# Patient Record
Sex: Female | Born: 1980 | Race: Black or African American | Hispanic: No | Marital: Single | Smoking: Never smoker
Health system: Southern US, Community
[De-identification: ages and names within clinical notes are randomized; demographics above are authoritative.]

## PROBLEM LIST (undated history)

## (undated) HISTORY — PX: TUBAL LIGATION: SHX77

## (undated) HISTORY — PX: CHOLECYSTECTOMY: SHX55

## (undated) HISTORY — PX: LAPAROSCOPIC GASTRIC SLEEVE RESECTION: SHX5895

---

## 2012-06-18 ENCOUNTER — Emergency Department: Payer: Self-pay | Admitting: Emergency Medicine

## 2012-06-25 ENCOUNTER — Ambulatory Visit: Payer: Self-pay

## 2014-09-02 ENCOUNTER — Encounter: Payer: Self-pay | Admitting: *Deleted

## 2014-09-02 ENCOUNTER — Emergency Department: Payer: Medicaid Other

## 2014-09-02 ENCOUNTER — Emergency Department
Admission: EM | Admit: 2014-09-02 | Discharge: 2014-09-02 | Disposition: A | Discharge status: Home / Self Care | Payer: Medicaid Other | Attending: Emergency Medicine | Admitting: Emergency Medicine

## 2014-09-02 DIAGNOSIS — Z880 Allergy status to penicillin: Secondary | ICD-10-CM | POA: Diagnosis not present

## 2014-09-02 DIAGNOSIS — N9089 Other specified noninflammatory disorders of vulva and perineum: Secondary | ICD-10-CM | POA: Insufficient documentation

## 2014-09-02 DIAGNOSIS — K5792 Diverticulitis of intestine, part unspecified, without perforation or abscess without bleeding: Secondary | ICD-10-CM | POA: Insufficient documentation

## 2014-09-02 DIAGNOSIS — Z3202 Encounter for pregnancy test, result negative: Secondary | ICD-10-CM | POA: Diagnosis not present

## 2014-09-02 DIAGNOSIS — R102 Pelvic and perineal pain: Secondary | ICD-10-CM | POA: Diagnosis present

## 2014-09-02 LAB — WET PREP, GENITAL
Clue Cells Wet Prep HPF POC: NONE SEEN
Trich, Wet Prep: NONE SEEN
Yeast Wet Prep HPF POC: NONE SEEN

## 2014-09-02 LAB — URINALYSIS COMPLETE WITH MICROSCOPIC (ARMC ONLY)
Bacteria, UA: NONE SEEN
Bilirubin Urine: NEGATIVE
Glucose, UA: NEGATIVE mg/dL
Ketones, ur: NEGATIVE mg/dL
Leukocytes, UA: NEGATIVE
Nitrite: NEGATIVE
PH: 5 (ref 5.0–8.0)
Protein, ur: NEGATIVE mg/dL
SPECIFIC GRAVITY, URINE: 1.023 (ref 1.005–1.030)

## 2014-09-02 LAB — COMPREHENSIVE METABOLIC PANEL
ALBUMIN: 3.3 g/dL — AB (ref 3.5–5.0)
ALT: 16 U/L (ref 14–54)
AST: 15 U/L (ref 15–41)
Alkaline Phosphatase: 79 U/L (ref 38–126)
Anion gap: 9 (ref 5–15)
BUN: 10 mg/dL (ref 6–20)
CALCIUM: 9.1 mg/dL (ref 8.9–10.3)
CHLORIDE: 103 mmol/L (ref 101–111)
CO2: 24 mmol/L (ref 22–32)
CREATININE: 0.93 mg/dL (ref 0.44–1.00)
GFR calc non Af Amer: >60 mL/min (ref >60)
Glucose, Bld: 116 mg/dL — ABNORMAL HIGH (ref 65–99)
Potassium: 3.6 mmol/L (ref 3.5–5.1)
SODIUM: 136 mmol/L (ref 135–145)
Total Bilirubin: 0.6 mg/dL (ref 0.3–1.2)
Total Protein: 7.2 g/dL (ref 6.5–8.1)

## 2014-09-02 LAB — CHLAMYDIA/NGC RT PCR (ARMC ONLY)
Chlamydia Tr: NOT DETECTED
N GONORRHOEAE: NOT DETECTED

## 2014-09-02 LAB — CBC WITH DIFFERENTIAL/PLATELET
Basophils Absolute: 0.1 10*3/uL (ref 0–0.1)
Basophils Relative: 1 %
Eosinophils Absolute: 0.1 10*3/uL (ref 0–0.7)
Eosinophils Relative: 1 %
HEMATOCRIT: 41.8 % (ref 35.0–47.0)
Hemoglobin: 13.4 g/dL (ref 12.0–16.0)
Lymphocytes Relative: 11 %
Lymphs Abs: 1.7 10*3/uL (ref 1.0–3.6)
MCH: 27.1 pg (ref 26.0–34.0)
MCHC: 32.1 g/dL (ref 32.0–36.0)
MCV: 84.2 fL (ref 80.0–100.0)
Monocytes Absolute: 0.7 10*3/uL (ref 0.2–0.9)
Monocytes Relative: 5 %
NEUTROS PCT: 82 %
Neutro Abs: 12.6 10*3/uL — ABNORMAL HIGH (ref 1.4–6.5)
PLATELETS: 242 10*3/uL (ref 150–440)
RBC: 4.96 MIL/uL (ref 3.80–5.20)
RDW: 14.6 % — ABNORMAL HIGH (ref 11.5–14.5)
WBC: 15.1 10*3/uL — AB (ref 3.6–11.0)

## 2014-09-02 LAB — POCT PREGNANCY, URINE: Preg Test, Ur: NEGATIVE

## 2014-09-02 LAB — HCG, QUANTITATIVE, PREGNANCY: HCG, BETA CHAIN, QUANT, S: 1 m[IU]/mL (ref <5)

## 2014-09-02 MED ORDER — METRONIDAZOLE 500 MG PO TABS: 500.0000 mg | ORAL_TABLET | Freq: Three times a day (TID) | ORAL | Status: AC

## 2014-09-02 MED ORDER — OXYCODONE-ACETAMINOPHEN 5-325 MG PO TABS: 1.0000 | ORAL_TABLET | Freq: Three times a day (TID) | ORAL | Status: DC | PRN

## 2014-09-02 MED ORDER — SODIUM CHLORIDE 0.9 % IV SOLN
Freq: Once | INTRAVENOUS | Status: AC
  Administered 2014-09-02: 12:00:00 via INTRAVENOUS

## 2014-09-02 MED ORDER — CIPROFLOXACIN HCL 500 MG PO TABS: 500.0000 mg | ORAL_TABLET | Freq: Two times a day (BID) | ORAL | Status: AC

## 2014-09-02 MED ORDER — IOHEXOL 240 MG/ML SOLN
25.0000 mL | Freq: Once | INTRAMUSCULAR | Status: AC | PRN
  Administered 2014-09-02: 25 mL via ORAL
  Filled 2014-09-02: qty 25

## 2014-09-02 MED ORDER — IOHEXOL 300 MG/ML  SOLN
100.0000 mL | Freq: Once | INTRAMUSCULAR | Status: AC | PRN
  Administered 2014-09-02: 100 mL via INTRAVENOUS
  Filled 2014-09-02: qty 100

## 2014-09-02 MED ORDER — ONDANSETRON 4 MG PO TBDP: 4.0000 mg | ORAL_TABLET | Freq: Three times a day (TID) | ORAL | Status: DC | PRN

## 2014-09-02 NOTE — Discharge Instructions (Signed)
Take medication as prescribed. This is very important. Rest. Drink plenty of fluids. Avoid strenuous activities.  As discussed follow-up closely with her primary care physician. Follow-up this week. If unable to be seen by PCP follow-up with gastroenterology. See above to call to schedule.  Return to the ER for fever, increased pain, inability to tolerate food or fluids, rectal bleeding, new or worsening concerns.  Diverticulitis Diverticulitis is inflammation or infection of small pouches in your colon that form when you have a condition called diverticulosis. The pouches in your colon are called diverticula. Your colon, or large intestine, is where water is absorbed and stool is formed. Complications of diverticulitis can include:  Bleeding.  Severe infection.  Severe pain.  Perforation of your colon.  Obstruction of your colon. CAUSES  Diverticulitis is caused by bacteria. Diverticulitis happens when stool becomes trapped in diverticula. This allows bacteria to grow in the diverticula, which can lead to inflammation and infection. RISK FACTORS People with diverticulosis are at risk for diverticulitis. Eating a diet that does not include enough fiber from fruits and vegetables may make diverticulitis more likely to develop. SYMPTOMS  Symptoms of diverticulitis may include:  Abdominal pain and tenderness. The pain is normally located on the left side of the abdomen, but may occur in other areas.  Fever and chills.  Bloating.  Cramping.  Nausea.  Vomiting.  Constipation.  Diarrhea.  Blood in your stool. DIAGNOSIS  Your health care provider will ask you about your medical history and do a physical exam. You may need to have tests done because many medical conditions can cause the same symptoms as diverticulitis. Tests may include:  Blood tests.  Urine tests.  Imaging tests of the abdomen, including X-rays and CT scans. When your condition is under control, your  health care provider may recommend that you have a colonoscopy. A colonoscopy can show how severe your diverticula are and whether something else is causing your symptoms. TREATMENT  Most cases of diverticulitis are mild and can be treated at home. Treatment may include:  Taking over-the-counter pain medicines.  Following a clear liquid diet.  Taking antibiotic medicines by mouth for 7-10 days. More severe cases may be treated at a hospital. Treatment may include:  Not eating or drinking.  Taking prescription pain medicine.  Receiving antibiotic medicines through an IV tube.  Receiving fluids and nutrition through an IV tube.  Surgery. HOME CARE INSTRUCTIONS   Follow your health care provider's instructions carefully.  Follow a full liquid diet or other diet as directed by your health care provider. After your symptoms improve, your health care provider may tell you to change your diet. He or she may recommend you eat a high-fiber diet. Fruits and vegetables are good sources of fiber. Fiber makes it easier to pass stool.  Take fiber supplements or probiotics as directed by your health care provider.  Only take medicines as directed by your health care provider.  Keep all your follow-up appointments. SEEK MEDICAL CARE IF:   Your pain does not improve.  You have a hard time eating food.  Your bowel movements do not return to normal. SEEK IMMEDIATE MEDICAL CARE IF:   Your pain becomes worse.  Your symptoms do not get better.  Your symptoms suddenly get worse.  You have a fever.  You have repeated vomiting.  You have bloody or black, tarry stools. MAKE SURE YOU:   Understand these instructions.  Will watch your condition.  Will get help right away  if you are not doing well or get worse. Document Released: 12/22/2004 Document Revised: 03/19/2013 Document Reviewed: 02/06/2013 [facility] Patient Information 2015 [employer], [place]. This information is not intended to  replace advice given to you by your health care provider. Make sure you discuss any questions you have with your health care provider.

## 2014-09-02 NOTE — ED Notes (Signed)
Patient transported to CT 

## 2014-09-02 NOTE — ED Notes (Signed)
Pt reports pelvic pain since last Sunday, pt reports clear discharge

## 2014-09-02 NOTE — ED Provider Notes (Signed)
[facility] Emergency Department Provider Note ____________________________________________  Time seen: Approximately 11:58 AM  I have reviewed the triage vital signs and the nursing notes.   HISTORY  Chief Complaint Pelvic Pain   HPI Natasha Reyes is a 34 y.o. female presents to the ER for complaints of 2 day history of pelvic pain. Patient states pelvic pain is primarily on the left. Describes as sharp intermittent pain. Patient states that for approximate 6 months she has had irregular menstrual cycles. With the last menstrual cycle began in April 2016. Patient states that this morning she had a mild amount of clear vaginal discharge. Denies vaginal bleeding or vaginal pain.  States pain is currently 7 out of 10 and then left pelvic and left lower abdomen area. Denies fever, nausea, vomiting, diarrhea, chest pain, shortness of breath. Reports continues to eat and drink well. reports continues to urinate and move bowels normally and well.     History reviewed. No pertinent past medical history. Left ovarian cyst  [id number]  There are no active problems to display for this patient.   Past Surgical History  Procedure Laterality Date  . Cholecystectomy    . Tubal ligation      No current outpatient prescriptions on file.  Allergies Amoxicillin  No family history on file.  Social History History  Substance Use Topics  . Smoking status: Never Smoker   . Smokeless tobacco: Not on file  . Alcohol Use: No    Review of Systems Constitutional: No fever/chills Eyes: No visual changes. ENT: No sore throat. Cardiovascular: Denies chest pain. Respiratory: Denies shortness of breath. Gastrointestinal: Complains of left lower pelvic pain as above.  No nausea, no vomiting.  No diarrhea.  No constipation. Genitourinary: Negative for dysuria. Musculoskeletal: Negative for back pain. Skin: Negative for rash. Neurological: Negative for headaches, focal  weakness or numbness.  10-point ROS otherwise negative.  ____________________________________________   PHYSICAL EXAM:  VITAL SIGNS: ED Triage Vitals  Enc Vitals Group     BP 09/02/14 1035 167/80 mmHg     Pulse Rate 09/02/14 1035 125     Resp 09/02/14 1035 18     Temp 09/02/14 1035 99.9 F (37.7 C)     Temp Source 09/02/14 1035 Oral     SpO2 09/02/14 1035 96 %     Weight 09/02/14 1035 290 lb (131.543 kg)     Height 09/02/14 1035 5\' 7"  (1.702 m)     Head Cir --      Peak Flow --      Pain Score 09/02/14 1102 7     Pain Loc --      Pain Edu? --      Excl. in GC? --   Blood pressure 155/80, pulse 121, temperature 98.3 F (36.8 C), temperature source Oral, resp. rate 18, height 5\' 7"  (1.702 m), weight 290 lb (131.543 kg), last menstrual period 08/11/2014, SpO2 97 %.  Today's Vitals   09/02/14 1137 09/02/14 1618 09/02/14 1716 09/02/14 1716  BP: 155/80 147/83  148/82  Pulse: 121 99  88  Temp: 98.3 F (36.8 C) 98.5 F (36.9 C)    TempSrc: Oral     Resp: 18 16  20   Height: 5\' 7"  (1.702 m)     Weight: 290 lb (131.543 kg)     SpO2: 97% 98%  98%  PainSc:   0-No pain     Constitutional: Alert and oriented. Well appearing and in no acute distress. Eyes: Conjunctivae are normal.  PERRL. EOMI. Head: Atraumatic. Nose: No congestion/rhinnorhea. Mouth/Throat: Mucous membranes are moist.  Oropharynx non-erythematous. Neck: No stridor. No cervical spine tenderness to palpation. Hematological/Lymphatic/Immunilogical: No cervical lymphadenopathy. Cardiovascular: Normal rate, regular rhythm. Grossly normal heart sounds.  Good peripheral circulation. Respiratory: Normal respiratory effort.  No retractions. Lungs CTAB. Gastrointestinal: No distention. No abdominal bruits. No CVA tenderness. Left lower quadrant mild to mod TTP. No guarding.  Genitourinary:  Pelvic exam: with Ed tech Felicia at bedside normal external genitalia Speculum: mild whitish discharge Bimanual: mild to mod  left adnexal area TTP, no cervical or right adnexal tenderness.  Musculoskeletal: No lower extremity tenderness nor edema.  No joint effusions. Neurologic:  Normal speech and language. No gross focal neurologic deficits are appreciated. Speech is normal. No gait instability. Skin:  Skin is warm, dry and intact. No rash noted. Psychiatric: Mood and affect are normal. Speech and behavior are normal.  ____________________________________________   LABS (all labs ordered are listed, but only abnormal results are displayed)  Labs Reviewed  WET PREP, GENITAL - Abnormal; Notable for the following:    WBC, Wet Prep HPF POC FEW (*)    All other components within normal limits  URINALYSIS COMPLETEWITH MICROSCOPIC ([facility] ONLY) - Abnormal; Notable for the following:    Color, Urine YELLOW (*)    APPearance CLEAR (*)    Hgb urine dipstick 1+ (*)    Squamous Epithelial / LPF 6-30 (*)    All other components within normal limits  CBC WITH DIFFERENTIAL/PLATELET - Abnormal; Notable for the following:    WBC 15.1 (*)    RDW 14.6 (*)    Neutro Abs 12.6 (*)    All other components within normal limits  COMPREHENSIVE METABOLIC PANEL - Abnormal; Notable for the following:    Glucose, Bld 116 (*)    Albumin 3.3 (*)    All other components within normal limits  CHLAMYDIA/NGC RT PCR ([facility] ONLY)  HCG, QUANTITATIVE, PREGNANCY  POCT PREGNANCY, URINE    RADIOLOGY  TRANSABDOMINAL AND TRANSVAGINAL ULTRASOUND OF PELVIS  TECHNIQUE: Both transabdominal and transvaginal ultrasound examinations of the pelvis were performed. Transabdominal technique was performed for global imaging of the pelvis including uterus, ovaries, adnexal regions, and pelvic cul-de-sac. It was necessary to proceed with endovaginal exam following the transabdominal exam to visualize the ovaries.  COMPARISON: None  FINDINGS: Uterus  Measurements: 9.7 x 4.7 x 6.6 cm. No fibroids or other  mass visualized.  Endometrium  Thickness: 9 mm. No focal abnormality visualized; trace fluid in the lower endometrial canal (image 66).  Right ovary  Measurements: 3.1 x 2.1 x 3.1 cm. Probable dominant follicle measuring 10 mm (image 56). Normal appearance/no adnexal mass.  Left ovary  Measurements: 3.1 x 2.1 x 1.8 cm. Multiple small follicles. Normal appearance/no adnexal mass.  Other findings  Trace simple appearing pelvic free fluid.  IMPRESSION: No left adnexal abnormality. Essentially normal for age pelvis ultrasound.   Electronically Signed By: Odessa Fleming M.D. On: 09/02/2014 15:05      CT ABDOMEN AND PELVIS WITH CONTRAST  TECHNIQUE: Multidetector CT imaging of the abdomen and pelvis was performed using the standard protocol following bolus administration of intravenous contrast.  CONTRAST: OMNIPAQUE IOHEXOL 300 MG/ML SOLN  COMPARISON: None.  FINDINGS: Lower chest: The lung bases are clear. The heart size is normal.  Hepatobiliary: The liver is diffusely low in attenuation consistent with hepatic steatosis. There is mild pneumobilia. The gallbladder is surgically absent. There is no intrahepatic or extrahepatic biliary ductal dilatation.  Pancreas:  Normal.  Spleen: Normal.  Adrenals/Urinary Tract: The adrenal glands and right kidney are normal. There are left parapelvic cysts. The left kidney in otherwise normal. There is no urolithiasis or obstructive uropathy. The bladder is normal.  Stomach/Bowel: There is no bowel dilatation to suggest obstruction. There is diverticulosis and bowel wall thickening involving the descending colon - sigmoid colon junction with pericolonic inflammatory changes. There is no extra luminal air. There is no pneumoperitoneum, pneumatosis or portal venous gas. There is trace pelvic free fluid.  Vascular/Lymphatic: The abdominal aorta is normal in caliber. There is no abdominal or pelvic lymphadenopathy.  There is no inguinal lymphadenopathy.  Reproductive: The uterus and ovaries are unremarkable.  Other: There is no fluid collection or hematoma.  Musculoskeletal: There is no lytic or sclerotic osseous lesion. There is no acute osseous abnormality.  IMPRESSION: 1. Mild diverticulosis with wall thickening and surrounding inflammation involving the descending colon-sigmoid colon junction most consistent with acute diverticulitis. No focal fluid collection to suggest an abscess.   Electronically Signed By: Elige Ko On: 09/02/2014 16:10 ____________________________________________ _____________________________________   INITIAL IMPRESSION / ASSESSMENT AND PLAN / ED COURSE  Pertinent labs & imaging results that were available during my care of the patient were reviewed by me and considered in my medical decision making (see chart for details).  1400: no acute distress. Resting calmly in bed. Awaiting ultrasound results.   1500: pt reexamined. Reports 7/10 LLQ abdominal pain continued. Denies need for current pain medications. Unremarkable ultrasound, WBC 15, and continued pain. Will evaluated by CT. Pt verbalized understanding and agreed to plan. Patient denies need for pain medication at this time. Spouse at bedside.   1620: Ct abdomen with mild diverticulosis with wall thickening and surrounding inflammation involving descending colon and sigmoid colon junction most consistent with acute diverticulitis,  no focal fluid collection to suggest abscess. Well-appearing patient. Tolerating by mouth fluids in ER. Afebrile. Will treat patient outpatient with oral Cipro and Flagyl as well as when necessary pain medication and nausea. Discussed strict follow-up and return parameters. Patient to follow with PCP in 3 days. Patient agreed to plan. ____________________________________________   FINAL CLINICAL IMPRESSION(S) / ED DIAGNOSES  Final diagnoses:  Adnexal tenderness, left   Diverticulitis of intestine, without perforation or abscess without bleeding      Renford Dills, NP 09/02/14 1731  Myrna Blazer, MD 09/03/14 [phone]

## 2014-09-02 NOTE — ED Notes (Signed)
Pt returned from ultrasound via stretcher.

## 2015-08-24 ENCOUNTER — Encounter: Payer: Self-pay | Admitting: Medical Oncology

## 2015-08-24 ENCOUNTER — Emergency Department
Admission: EM | Admit: 2015-08-24 | Discharge: 2015-08-24 | Disposition: A | Discharge status: Home / Self Care | Payer: Medicaid Other | Attending: Emergency Medicine | Admitting: Emergency Medicine

## 2015-08-24 DIAGNOSIS — Z791 Long term (current) use of non-steroidal anti-inflammatories (NSAID): Secondary | ICD-10-CM | POA: Insufficient documentation

## 2015-08-24 DIAGNOSIS — Z79899 Other long term (current) drug therapy: Secondary | ICD-10-CM | POA: Diagnosis not present

## 2015-08-24 DIAGNOSIS — M545 Low back pain: Secondary | ICD-10-CM | POA: Diagnosis not present

## 2015-08-24 DIAGNOSIS — G8929 Other chronic pain: Secondary | ICD-10-CM | POA: Diagnosis not present

## 2015-08-24 LAB — URINALYSIS COMPLETE WITH MICROSCOPIC (ARMC ONLY)
Bacteria, UA: NONE SEEN
Bilirubin Urine: NEGATIVE
Glucose, UA: NEGATIVE mg/dL
KETONES UR: NEGATIVE mg/dL
LEUKOCYTES UA: NEGATIVE
Nitrite: NEGATIVE
PH: 6 (ref 5.0–8.0)
Protein, ur: NEGATIVE mg/dL
Specific Gravity, Urine: 1.021 (ref 1.005–1.030)

## 2015-08-24 LAB — POCT PREGNANCY, URINE: Preg Test, Ur: NEGATIVE

## 2015-08-24 MED ORDER — TRAMADOL HCL 50 MG PO TABS: ORAL_TABLET | ORAL | Status: DC

## 2015-08-24 MED ORDER — KETOROLAC TROMETHAMINE 60 MG/2ML IM SOLN
60.0000 mg | Freq: Once | INTRAMUSCULAR | Status: AC
  Administered 2015-08-24: 60 mg via INTRAMUSCULAR
  Filled 2015-08-24: qty 2
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
## 2015-08-24 NOTE — Discharge Instructions (Signed)
Continue taking medication that you received yesterday Flexeril every 8 hours and meloxicam once a day. Begin taking tramadol one or 2 tablets every 6 hours as needed for pain. Call tomorrow and make an appointment with Dr. Joice LoftsPoggi who [facility]

## 2015-08-24 NOTE — ED Notes (Signed)
Pt alert and oriented X4, active, cooperative, pt in NAD. RR even and unlabored, color WNL.  Pt informed to return if any life threatening symptoms occur.   
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
## 2015-10-30 IMAGING — CT CT ABD-PELV W/ CM
2 of 4 series · 16 of 46 positions shown, 18 images · IV contrast (omnipaque)
Comparison: None.

CLINICAL DATA: Left lower quadrant pain since [REDACTED]

EXAM:
CT ABDOMEN AND PELVIS WITH CONTRAST
TECHNIQUE: Multidetector CT imaging of the abdomen and pelvis was performed
using the standard protocol following bolus administration of
intravenous contrast.
CONTRAST:  100mL OMNIPAQUE IOHEXOL 300 MG/ML  SOLN

[Series 2: routine abd pel with · axial · 0.98mm/px · z∈[-1448,-984]mm · 13 of 103 slices shown, 15 images]
[im 5/103  soft-tissue]
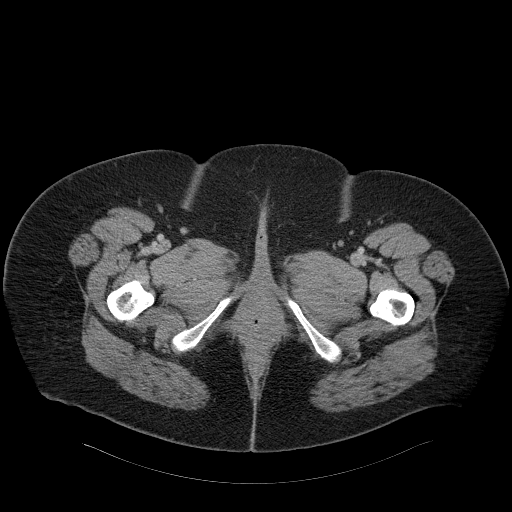
[im 5/103  bone]
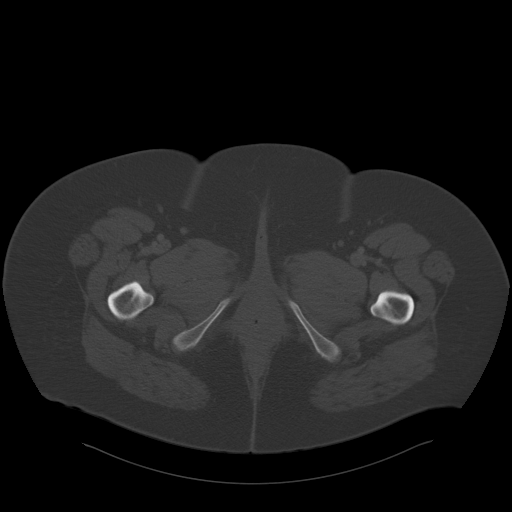
[im 14/103  soft-tissue]
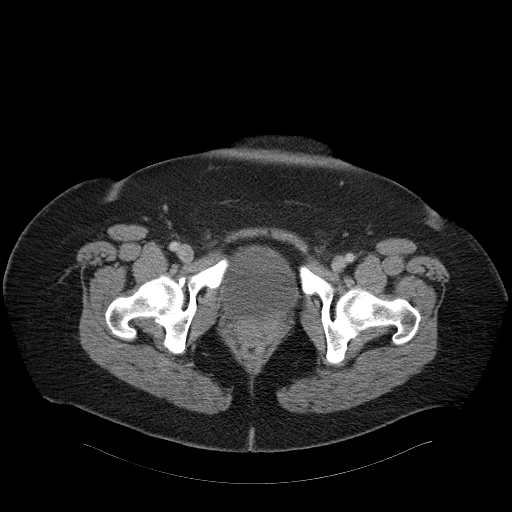
[im 23/103  soft-tissue]
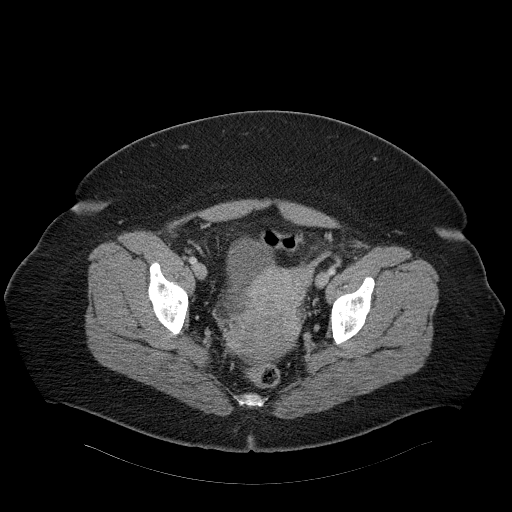
[im 27/103  soft-tissue]
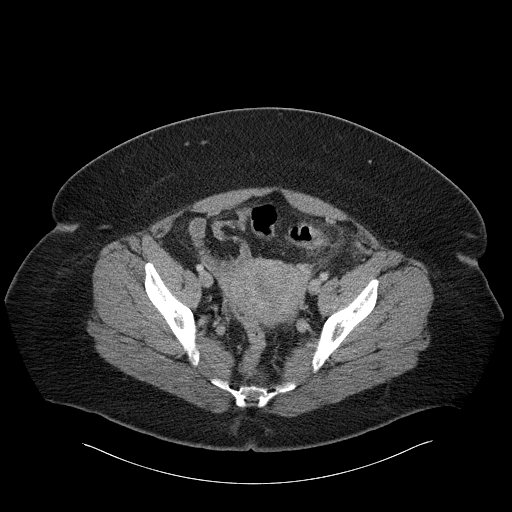
[im 36/103  soft-tissue]
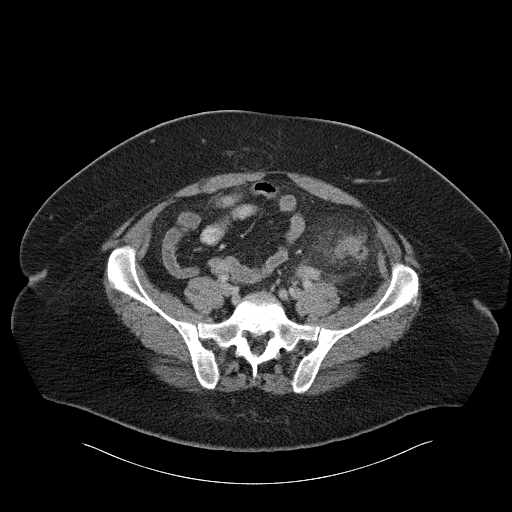
[im 45/103  soft-tissue]
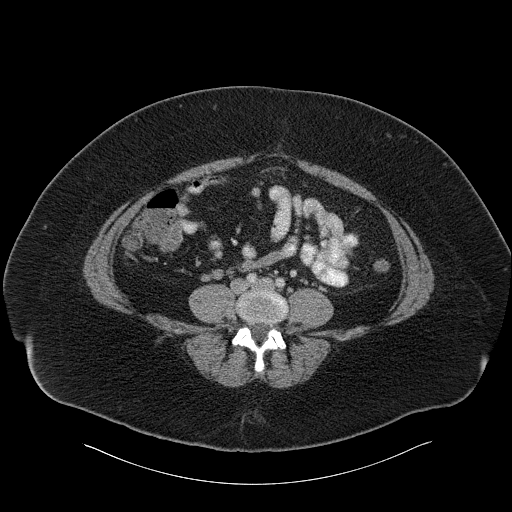
[im 54/103  soft-tissue]
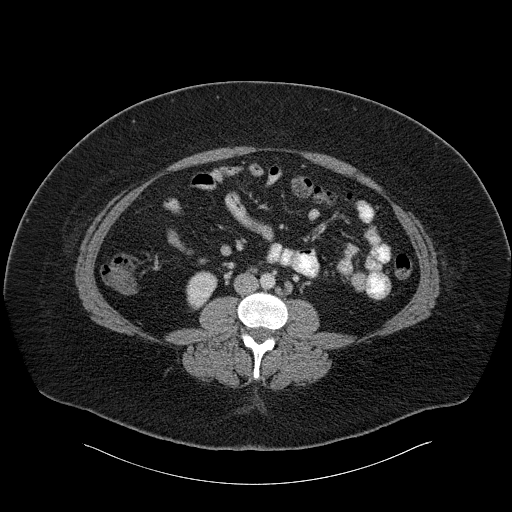
[im 58/103  soft-tissue]
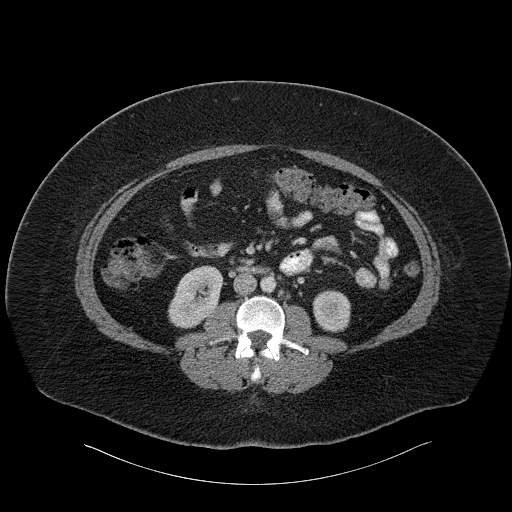
[im 67/103  soft-tissue]
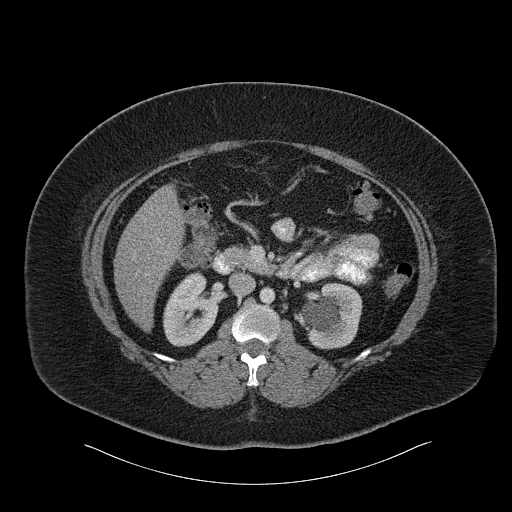
[im 67/103  bone]
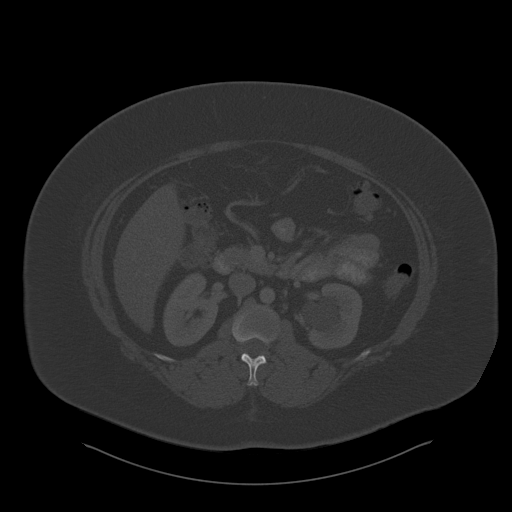
[im 76/103  soft-tissue]
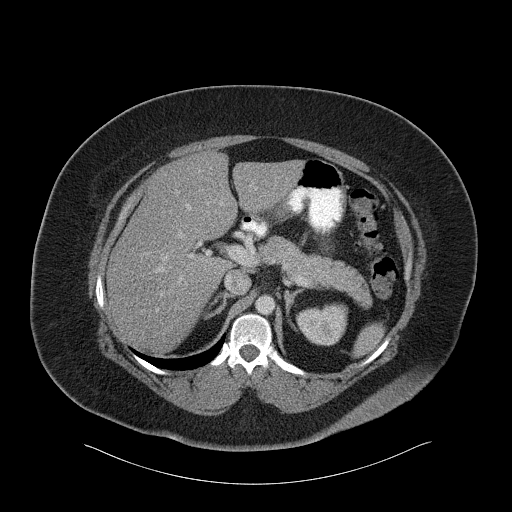
[im 80/103  soft-tissue]
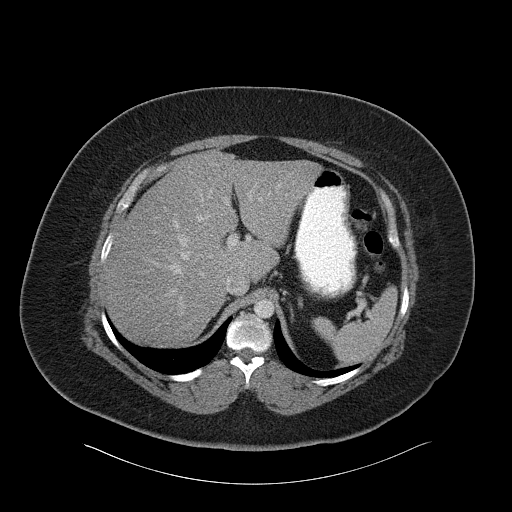
[im 89/103  soft-tissue]
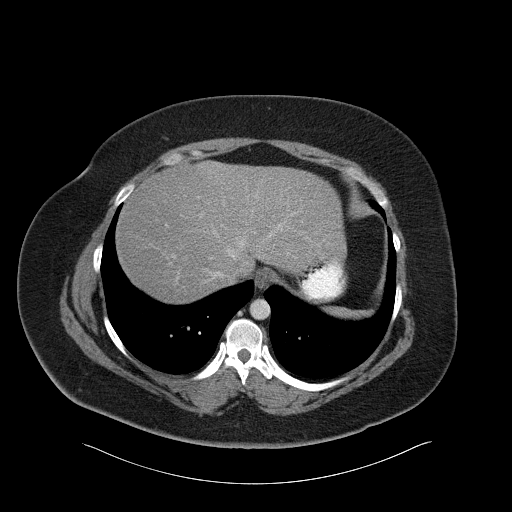
[im 98/103  soft-tissue]
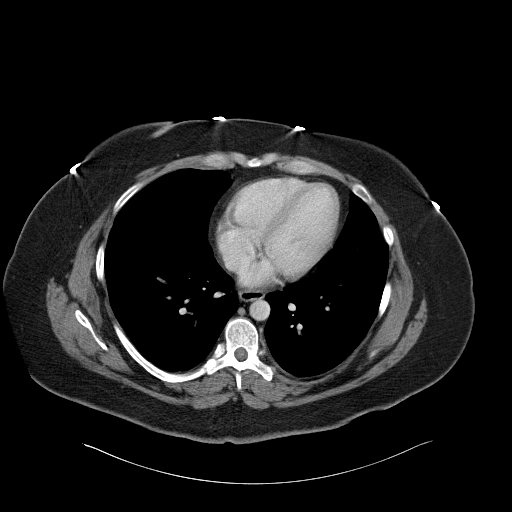

[Series 5: cor routine abd pel with · coronal · 0.84mm/px · 3 of 172 slices shown]
[im 58/172  soft-tissue]
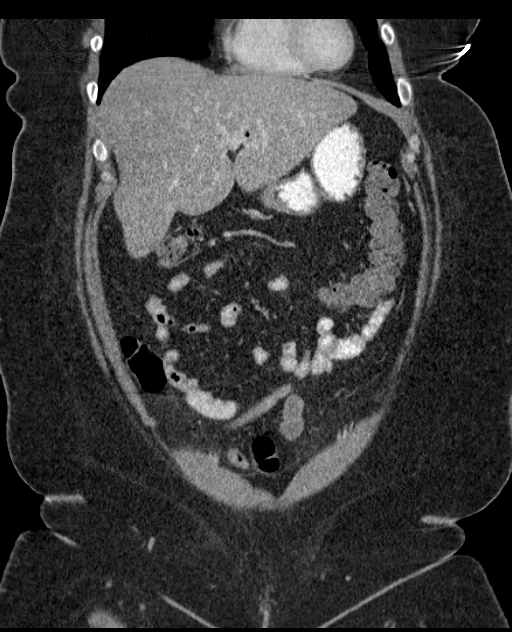
[im 77/172  soft-tissue]
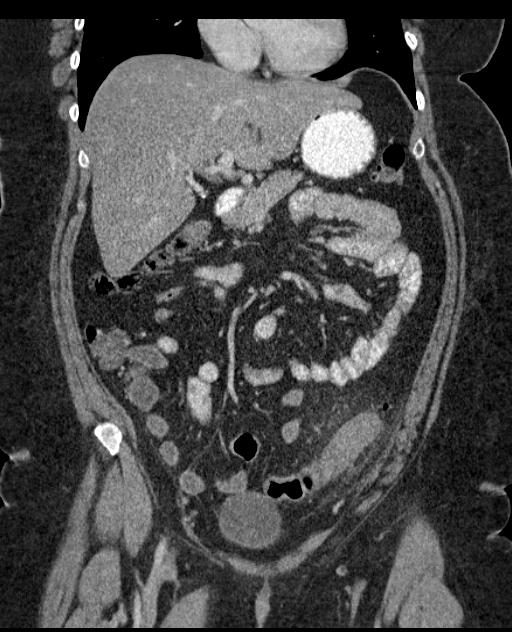
[im 96/172  soft-tissue]
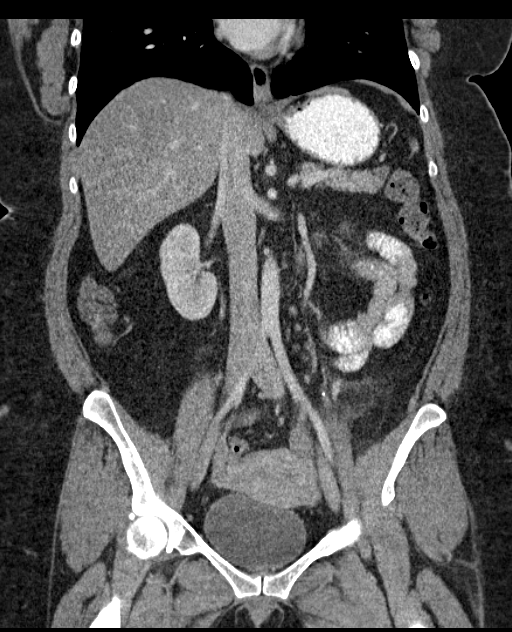

[16 of 46 positions shown; findings below may reference images not displayed]

FINDINGS: Lower chest:  The lung bases are clear.  The heart size is normal.

Hepatobiliary: The liver is diffusely low in attenuation consistent
with hepatic steatosis. There is mild pneumobilia. The gallbladder
is surgically absent. There is no intrahepatic or extrahepatic
biliary ductal dilatation.

Pancreas: Normal.

Spleen: Normal.

Adrenals/Urinary Tract: The adrenal glands and right kidney are
normal. There are left parapelvic cysts. The left kidney in
otherwise normal. There is no urolithiasis or obstructive uropathy.
The bladder is normal.

Stomach/Bowel: There is no bowel dilatation to suggest obstruction.
There is diverticulosis and bowel wall thickening involving the
descending colon - sigmoid colon junction with pericolonic
inflammatory changes. There is no extra luminal air. There is no
pneumoperitoneum, pneumatosis or portal venous gas. There is trace
pelvic free fluid.

Vascular/Lymphatic: The abdominal aorta is normal in caliber. There
is no abdominal or pelvic lymphadenopathy. There is no inguinal
lymphadenopathy.

Reproductive: The uterus and ovaries are unremarkable.

Other:  There is no fluid collection or hematoma.

Musculoskeletal: There is no lytic or sclerotic osseous lesion.
There is no acute osseous abnormality.
IMPRESSION: 1. Mild diverticulosis with wall thickening and surrounding
inflammation involving the descending colon-sigmoid colon junction
most consistent with acute diverticulitis. No focal fluid collection
to suggest an abscess.

## 2018-10-24 ENCOUNTER — Ambulatory Visit (INDEPENDENT_AMBULATORY_CARE_PROVIDER_SITE_OTHER): Payer: Medicaid Other | Admitting: Obstetrics and Gynecology

## 2018-10-24 ENCOUNTER — Encounter: Payer: Self-pay | Admitting: Obstetrics and Gynecology

## 2018-10-24 ENCOUNTER — Other Ambulatory Visit: Payer: Self-pay

## 2018-10-24 VITALS — BP 113/74 | HR 72 | Ht 67.0 in | Wt 226.4 lb

## 2018-10-24 DIAGNOSIS — N938 Other specified abnormal uterine and vaginal bleeding: Secondary | ICD-10-CM | POA: Diagnosis not present

## 2018-10-24 DIAGNOSIS — R102 Pelvic and perineal pain: Secondary | ICD-10-CM | POA: Diagnosis not present

## 2018-10-24 NOTE — Progress Notes (Signed)
HPI:      [name] is a 38 y.o. No obstetric history on file. who LMP was No LMP recorded.  Subjective:   She presents today with complaint of left-sided abdominal pain especially when doing "core exercises".  She denies changes with bowel movements or urination, no pain with sex, no postcoital bleeding, pain is worse with Valsalva maneuver. She also complains of 1 month of irregular vaginal bleeding.  She has a tubal ligation for birth control and typically has normal regular menses.  This month she has had "3 menstrual periods." Patient recently had a pelvic examination where nothing was found by her primary care physician and was reportedly found to have a normal Pap smear.  In addition STD cultures were performed and were all negative.   Of significant note patient states she had a similar pain 4 years ago when she was found to have an ovarian cyst by CT.    Hx: The following portions of the patient's history were reviewed and updated as appropriate:             She  has no past medical history on file. She does not have a problem list on file. She  has a past surgical history that includes Cholecystectomy and Tubal ligation. Her family history is not on file. She  reports that she has never smoked. She has never used smokeless tobacco. She reports that she does not drink alcohol or use drugs. She has a current medication list which includes the following prescription(s): metronidazole. She is allergic to amoxicillin.       Review of Systems:  Review of Systems  Constitutional: Denied constitutional symptoms, night sweats, recent illness, fatigue, fever, insomnia and weight loss.  Eyes: Denied eye symptoms, eye pain, photophobia, vision change and visual disturbance.  Ears/Nose/Throat/Neck: Denied ear, nose, throat or neck symptoms, hearing loss, nasal discharge, sinus congestion and sore throat.  Cardiovascular: Denied cardiovascular symptoms, arrhythmia, chest  pain/pressure, edema, exercise intolerance, orthopnea and palpitations.  Respiratory: Denied pulmonary symptoms, asthma, pleuritic pain, productive sputum, cough, dyspnea and wheezing.  Gastrointestinal: Denied, gastro-esophageal reflux, melena, nausea and vomiting.  Genitourinary: See HPI for additional information.  Musculoskeletal: Denied musculoskeletal symptoms, stiffness, swelling, muscle weakness and myalgia.  Dermatologic: Denied dermatology symptoms, rash and scar.  Neurologic: Denied neurology symptoms, dizziness, headache, neck pain and syncope.  Psychiatric: Denied psychiatric symptoms, anxiety and depression.  Endocrine: Denied endocrine symptoms including hot flashes and night sweats.   Meds:   Current Outpatient Medications on File Prior to Visit  Medication Sig Dispense Refill  . metroNIDAZOLE (FLAGYL) 500 MG tablet Take by mouth.     No current facility-administered medications on file prior to visit.     Objective:     Vitals:   10/24/18 0911  BP: 113/74  Pulse: 72              Abdominal examination reveals some pain in the left lower quadrant but not entirely consistent with hernia or ovarian cyst.  Pain worse with Valsalva.  No masses palpated.  Assessment:    No obstetric history on file. There are no active problems to display for this patient.    1. Pelvic pain in female   2. DUB (dysfunctional uterine bleeding)        Plan:            1.  Patient does not want any hormones for cycle control at this time.  She would like to employ expectant  management of her irregular bleeding presuming that I will return to normal next month.  2.  Ultrasound ordered to look for pelvic abnormalities because of her pain. Orders No orders of the defined types were placed in this encounter.   No orders of the defined types were placed in this encounter.     F/U  Return for We will contact her with any abnormal test results. I spent 31 minutes involved in the  care of this patient of which greater than 50% was spent discussing pelvic pain, history of ovarian cyst, cardiac pain, multiple causes of pelvic pain and treatments, birth control methods cycle regulatory methods etc.  All questions answered  Elonda Huskyavid J. Hallie Ertl, M.D. 10/24/2018 9:49 AM

## 2018-10-24 NOTE — Progress Notes (Signed)
Patient comes in today as a new patient. She had a CT scan several years ago and it showed ovarian cyst. She has not had any scan since then. This month she has started to have irregular cycles. She has been on her period 3 times this month. Only change she has had is exercise more this month.

## 2018-10-31 ENCOUNTER — Telehealth: Payer: Self-pay

## 2018-10-31 NOTE — Telephone Encounter (Signed)
Coronavirus (COVID-19) Are you at risk?  Are you at risk for the Coronavirus (COVID-19)?  To be considered HIGH RISK for Coronavirus (COVID-19), you have to meet the following criteria:  . Traveled to [place], [place], [place], [place] or [place]; or in the [place] to [place], [place], [place], or [place]; and have fever, cough, and shortness of breath within the last 2 weeks of travel OR . Been in close contact with a person diagnosed with COVID-19 within the last 2 weeks and have fever, cough, and shortness of breath . IF YOU DO NOT MEET THESE CRITERIA, YOU ARE CONSIDERED LOW RISK FOR COVID-19.  What to do if you are HIGH RISK for COVID-19?  . If you are having a medical emergency, call 911. . Seek medical care right away. Before you go to a doctor's office, urgent care or emergency department, call ahead and tell them about your recent travel, contact with someone diagnosed with COVID-19, and your symptoms. You should receive instructions from your physician's office regarding next steps of care.  . When you arrive at healthcare provider, tell the healthcare staff immediately you have returned from visiting [place], [place], [place], [place] or [place]; or traveled in the [place] to [place], [place], [place], or [place]; in the last two weeks or you have been in close contact with a person diagnosed with COVID-19 in the last 2 weeks.   . Tell the health care staff about your symptoms: fever, cough and shortness of breath. . After you have been seen by a medical provider, you will be either: o Tested for (COVID-19) and discharged home on quarantine except to seek medical care if symptoms worsen, and asked to  - Stay home and avoid contact with others until you get your results (4-5 days)  - Avoid travel on public transportation if possible (such as bus, train, or airplane) or o Sent to the Emergency Department by EMS for evaluation, COVID-19 testing, and possible  admission depending on your condition and test results.  What to do if you are LOW RISK for COVID-19?  Reduce your risk of any infection by using the same precautions used for avoiding the common cold or flu:  . Wash your hands often with soap and warm water for at least 20 seconds.  If soap and water are not readily available, use an alcohol-based hand sanitizer with at least 60% alcohol.  . If coughing or sneezing, cover your mouth and nose by coughing or sneezing into the elbow areas of your shirt or coat, into a tissue or into your sleeve (not your hands). . Avoid shaking hands with others and consider head nods or verbal greetings only. . Avoid touching your eyes, nose, or mouth with unwashed hands.  . Avoid close contact with people who are Natasha Reyes. . Avoid places or events with large numbers of people in one location, like concerts or sporting events. . Carefully consider travel plans you have or are making. . If you are planning any travel outside or inside the [place], visit the CDC's Travelers' Health webpage for the latest health notices. . If you have some symptoms but not all symptoms, continue to monitor at home and seek medical attention if your symptoms worsen. . If you are having a medical emergency, call 911.  10/31/18 SCREENING NEG SLS ADDITIONAL HEALTHCARE OPTIONS FOR PATIENTS  [facility] / e-Visit: [email or web address]         [facility]: [phone]    [facility]: [phone]                   [facility]: [phone]  

## 2018-11-01 ENCOUNTER — Ambulatory Visit (INDEPENDENT_AMBULATORY_CARE_PROVIDER_SITE_OTHER): Payer: Medicaid Other

## 2018-11-01 ENCOUNTER — Other Ambulatory Visit: Payer: Self-pay

## 2018-11-01 DIAGNOSIS — R102 Pelvic and perineal pain: Secondary | ICD-10-CM

## 2018-12-06 ENCOUNTER — Telehealth: Payer: Self-pay | Admitting: Obstetrics and Gynecology

## 2018-12-06 NOTE — Telephone Encounter (Signed)
Spoke with patient and she said that she would let her PCP prescribe BC pills for this. PCP has offered to do this.

## 2018-12-06 NOTE — Telephone Encounter (Signed)
Patient called requesting u/s results from August. Please Advise.Thanks

## 2018-12-06 NOTE — Telephone Encounter (Signed)
Please advise on US results.

## 2019-02-26 ENCOUNTER — Other Ambulatory Visit: Payer: Self-pay

## 2019-02-26 DIAGNOSIS — Z20822 Contact with and (suspected) exposure to covid-19: Secondary | ICD-10-CM

## 2019-02-28 LAB — NOVEL CORONAVIRUS, NAA: SARS-CoV-2, NAA: NOT DETECTED

## 2019-10-01 ENCOUNTER — Ambulatory Visit
Admission: EM | Admit: 2019-10-01 | Discharge: 2019-10-01 | Disposition: A | Discharge status: Home / Self Care | Payer: Medicaid Other

## 2019-10-01 ENCOUNTER — Other Ambulatory Visit: Payer: Self-pay

## 2019-10-01 DIAGNOSIS — H1132 Conjunctival hemorrhage, left eye: Secondary | ICD-10-CM

## 2019-10-01 NOTE — ED Provider Notes (Signed)
Renaldo Fiddler    CSN: [id number] Arrival date & time: 10/01/19  9390      History   Chief Complaint Chief Complaint  Patient presents with  . Eye Pain    HPI Natasha Reyes is a 39 y.o. female.   Patient presents with redness in her left eye since yesterday.  She states she thinks she "broke a blood vessel".  She denies acute eye pain, changes in her vision, eye drainage.  No known injury to her eye.  She is currently a student learning to apply false eyelashes and is concerned that she may have scratched her eye.  Patient also reports sinus congestion and pressure.  No treatment attempted.  She denies fever, chills, cough, shortness of breath, or other symptoms.  The history is provided by the patient.    History reviewed. No pertinent past medical history.  There are no problems to display for this patient.   Past Surgical History:  Procedure Laterality Date  . CHOLECYSTECTOMY    . LAPAROSCOPIC GASTRIC SLEEVE RESECTION    . TUBAL LIGATION      OB History   No obstetric history on file.      Home Medications    Prior to Admission medications   Medication Sig Start Date End Date Taking? Authorizing Provider  Ascorbic Acid (VITAMIN C) 100 MG CHEW Chew by mouth.   Yes [provider]  Biotin 1 MG CAPS Take by mouth.   Yes [provider]  bisacodyl (DULCOLAX) 5 MG EC tablet Take by mouth.   Yes [provider]  Inulin-Cholecalciferol 2.5-500 GM-UNIT CHEW Chew by mouth.   Yes [provider]  Multiple Vitamins-Minerals (MULTIVITAMIN ADULT EXTRA C PO) Take by mouth.   Yes [provider]  Inulin-Cholecalciferol 2.5-500 GM-UNIT CHEW Chew by mouth.    [provider]    Family History History reviewed. No pertinent family history.  Social History Social History   Tobacco Use  . Smoking status: Never Smoker  . Smokeless tobacco: Never Used  Substance Use Topics  . Alcohol use: No  . Drug use: Never      Allergies   Penicillins and Amoxicillin   Review of Systems Review of Systems  Constitutional: Negative for chills and fever.  HENT: Positive for congestion and sinus pressure. Negative for ear pain and sore throat.   Eyes: Positive for redness. Negative for pain and visual disturbance.  Respiratory: Negative for cough and shortness of breath.   Cardiovascular: Negative for chest pain and palpitations.  Gastrointestinal: Negative for abdominal pain and vomiting.  Genitourinary: Negative for dysuria and hematuria.  Musculoskeletal: Negative for arthralgias and back pain.  Skin: Negative for color change and rash.  Neurological: Negative for seizures and syncope.  All other systems reviewed and are negative.    Physical Exam Triage Vital Signs ED Triage Vitals  Enc Vitals Group     BP      Pulse      Resp      Temp      Temp src      SpO2      Weight      Height      Head Circumference      Peak Flow      Pain Score      Pain Loc      Pain Edu?      Excl. in GC?    No data found.  Updated Vital Signs BP 119/82 (BP  Location: Right Arm)   Pulse 79   Temp 99 F (37.2 C) (Oral)   Resp 16   LMP 09/19/2019 (Exact Date)   SpO2 98%   Visual Acuity Right Eye Distance: 20/25 Left Eye Distance: 20/16 Bilateral Distance: 20/16  Right Eye Near:   Left Eye Near:    Bilateral Near:     Physical Exam Vitals and nursing note reviewed.  Constitutional:      General: She is not in acute distress.    Appearance: She is well-developed.  HENT:     Head: Normocephalic and atraumatic.     Right Ear: Tympanic membrane normal.     Left Ear: Tympanic membrane normal.     Nose: Nose normal.     Mouth/Throat:     Mouth: Mucous membranes are moist.  Eyes:     General: Lids are normal. Lids are everted, no foreign bodies appreciated. Vision grossly intact.     Extraocular Movements: Extraocular movements intact.     Pupils: Pupils are equal, round, and reactive to  light.     Left eye: No fluorescein uptake.   Cardiovascular:     Rate and Rhythm: Normal rate and regular rhythm.     Heart sounds: No murmur heard.   Pulmonary:     Effort: Pulmonary effort is normal. No respiratory distress.     Breath sounds: Normal breath sounds.  Abdominal:     Palpations: Abdomen is soft.     Tenderness: There is no abdominal tenderness. There is no guarding or rebound.  Musculoskeletal:     Cervical back: Neck supple.  Skin:    General: Skin is warm and dry.     Findings: No rash.  Neurological:     General: No focal deficit present.     Mental Status: She is alert and oriented to person, place, and time.     Gait: Gait normal.  Psychiatric:        Mood and Affect: Mood normal.        Behavior: Behavior normal.      UC Treatments / Results  Labs (all labs ordered are listed, but only abnormal results are displayed) Labs Reviewed - No data to display  EKG   Radiology No results found.  Procedures Procedures (including critical care time)  Medications Ordered in UC Medications - No data to display  Initial Impression / Assessment and Plan / UC Course  I have reviewed the triage vital signs and the nursing notes.  Pertinent labs & imaging results that were available during my care of the patient were reviewed by me and considered in my medical decision making (see chart for details).   Left conjunctival hemorrhage.  No abrasion on exam.  Education provided and patient instructed to apply cold compresses to her eye as needed.  Started her to follow-up with her eye care provider in 1 to 2 days for recheck if her symptoms are not improving.  Instructed her to go to the emergency department if she has acute eye pain or changes in her vision.  Patient agrees to plan of care.   Final Clinical Impressions(s) / UC Diagnoses   Final diagnoses:  Conjunctival hemorrhage of left eye     Discharge Instructions     Apply cold compresses to your  eye.    Follow-up with your eye doctor for a recheck in 1 to 2 days if your symptoms are not improving.    Go to the emergency department if you  have acute eye pain or changes in your vision.         ED Prescriptions    None     PDMP not reviewed this encounter.   Mickie Bail, NP 10/01/19 1035

## 2019-10-01 NOTE — ED Triage Notes (Addendum)
Pt presents to UC for redness in left eye since last night. Pt states she noticed "blood vessels were swollen", then when she got up this morning they had "burst". Upon assessment pt noted to have large red spot on left eye. Pt denies visual changes or disturbances.  Pt endorsing pain in sinuses and nose on left side. Pt denies drainage out of left eye. Pt denies OTC treatments, or relieving factors.

## 2019-10-01 NOTE — ED Notes (Signed)
See visual acuity screening documented. Pt noted to wear corrective lenses at baseline. Pt not wearing them at this time.

## 2019-10-01 NOTE — Discharge Instructions (Addendum)
Apply cold compresses to your eye.    Follow-up with your eye doctor for a recheck in 1 to 2 days if your symptoms are not improving.    Go to the emergency department if you have acute eye pain or changes in your vision.

## 2020-10-03 ENCOUNTER — Emergency Department
Admission: EM | Admit: 2020-10-03 | Discharge: 2020-10-04 | Disposition: A | Discharge status: Home / Self Care | Payer: Medicaid Other | Attending: Emergency Medicine | Admitting: Emergency Medicine

## 2020-10-03 ENCOUNTER — Encounter: Payer: Self-pay | Admitting: Emergency Medicine

## 2020-10-03 ENCOUNTER — Other Ambulatory Visit: Payer: Self-pay

## 2020-10-03 DIAGNOSIS — Z046 Encounter for general psychiatric examination, requested by authority: Secondary | ICD-10-CM | POA: Insufficient documentation

## 2020-10-03 DIAGNOSIS — G43009 Migraine without aura, not intractable, without status migrainosus: Secondary | ICD-10-CM

## 2020-10-03 DIAGNOSIS — U071 COVID-19: Secondary | ICD-10-CM | POA: Diagnosis not present

## 2020-10-03 DIAGNOSIS — F430 Acute stress reaction: Secondary | ICD-10-CM | POA: Diagnosis present

## 2020-10-03 DIAGNOSIS — R45851 Suicidal ideations: Secondary | ICD-10-CM | POA: Diagnosis not present

## 2020-10-03 DIAGNOSIS — R509 Fever, unspecified: Secondary | ICD-10-CM | POA: Diagnosis present

## 2020-10-03 DIAGNOSIS — G43509 Persistent migraine aura without cerebral infarction, not intractable, without status migrainosus: Secondary | ICD-10-CM | POA: Diagnosis not present

## 2020-10-03 DIAGNOSIS — G43909 Migraine, unspecified, not intractable, without status migrainosus: Secondary | ICD-10-CM

## 2020-10-03 LAB — COMPREHENSIVE METABOLIC PANEL
ALT: 22 U/L (ref 0–44)
AST: 23 U/L (ref 15–41)
Albumin: 3.7 g/dL (ref 3.5–5.0)
Alkaline Phosphatase: 48 U/L (ref 38–126)
Anion gap: 11 (ref 5–15)
BUN: 10 mg/dL (ref 6–20)
CO2: 20 mmol/L — ABNORMAL LOW (ref 22–32)
Calcium: 8.7 mg/dL — ABNORMAL LOW (ref 8.9–10.3)
Chloride: 103 mmol/L (ref 98–111)
Creatinine, Ser: 0.83 mg/dL (ref 0.44–1.00)
GFR, Estimated: >60 mL/min (ref >60)
Glucose, Bld: 111 mg/dL — ABNORMAL HIGH (ref 70–99)
Potassium: 3.9 mmol/L (ref 3.5–5.1)
Sodium: 134 mmol/L — ABNORMAL LOW (ref 135–145)
Total Bilirubin: 0.4 mg/dL (ref 0.3–1.2)
Total Protein: 7.8 g/dL (ref 6.5–8.1)

## 2020-10-03 LAB — ACETAMINOPHEN LEVEL: Acetaminophen (Tylenol), Serum: <10 ug/mL — ABNORMAL LOW (ref 10–30)

## 2020-10-03 LAB — CBC
HCT: 44.7 % (ref 36.0–46.0)
Hemoglobin: 15.1 g/dL — ABNORMAL HIGH (ref 12.0–15.0)
MCH: 30.4 pg (ref 26.0–34.0)
MCHC: 33.8 g/dL (ref 30.0–36.0)
MCV: 90.1 fL (ref 80.0–100.0)
Platelets: 202 10*3/uL (ref 150–400)
RBC: 4.96 MIL/uL (ref 3.87–5.11)
RDW: 13.1 % (ref 11.5–15.5)
WBC: 6.1 10*3/uL (ref 4.0–10.5)
nRBC: 0 % (ref 0.0–0.2)

## 2020-10-03 LAB — ETHANOL: Alcohol, Ethyl (B): <10 mg/dL (ref <10)

## 2020-10-03 LAB — SALICYLATE LEVEL: Salicylate Lvl: <7 mg/dL — ABNORMAL LOW (ref 7.0–30.0)

## 2020-10-03 MED ORDER — SODIUM CHLORIDE 0.9 % IV BOLUS (SEPSIS)
1000.0000 mL | Freq: Once | INTRAVENOUS | Status: AC
  Administered 2020-10-04: 1000 mL via INTRAVENOUS

## 2020-10-03 MED ORDER — DIPHENHYDRAMINE HCL 50 MG/ML IJ SOLN
25.0000 mg | Freq: Once | INTRAMUSCULAR | Status: AC
  Administered 2020-10-04: 25 mg via INTRAVENOUS
  Filled 2020-10-03: qty 1

## 2020-10-03 MED ORDER — KETOROLAC TROMETHAMINE 30 MG/ML IJ SOLN
30.0000 mg | Freq: Once | INTRAMUSCULAR | Status: AC
  Administered 2020-10-04: 30 mg via INTRAVENOUS
  Filled 2020-10-03: qty 1

## 2020-10-03 MED ORDER — METOCLOPRAMIDE HCL 5 MG/ML IJ SOLN
10.0000 mg | Freq: Once | INTRAMUSCULAR | Status: AC
  Administered 2020-10-04: 10 mg via INTRAVENOUS
  Filled 2020-10-03: qty 2

## 2020-10-03 NOTE — ED Notes (Signed)
Patient reports 20 year history of migraines.  Reports having migraine that started today and unable to get relief with prescribed medications.  When asked about the comment of shooting herself patient states "if the pain doesn't stop".  Then asked patient if she would harm herself due to pain from headache, patient just shrugged her shoulders.  Patient tearful at this time.

## 2020-10-03 NOTE — ED Triage Notes (Signed)
Pt to ED via POV with c/o migraine x several days. Pt states has migraines q month. Pt states has taken her home prescribed medication Rizatriptan without relief. Pt also c/o feeling like her left tonsil was itching, states sprayed it earlier and is no longer itching.    Pt also voices SI with a plan to shoot herself. Pt tearful in triage at this time. Pt states thoughts started earlier today. Pt states has access to a gun at home.

## 2020-10-03 NOTE — ED Notes (Signed)
1 pair gray shiny earrings with clear stones, 1 pair gray shiny dangle earrings with turquoise stones, 2 grey shiny rings with clear stones, 1 black hat, 1 pair black slide on shoes, 1 black t- shirt, 1 black bra, 1 pair black and gray leggings, 2 pair black underwear, 1 teal bag, 1 black and multicolored blanket.

## 2020-10-04 DIAGNOSIS — G43509 Persistent migraine aura without cerebral infarction, not intractable, without status migrainosus: Secondary | ICD-10-CM

## 2020-10-04 DIAGNOSIS — F430 Acute stress reaction: Secondary | ICD-10-CM | POA: Diagnosis present

## 2020-10-04 DIAGNOSIS — U071 COVID-19: Secondary | ICD-10-CM

## 2020-10-04 DIAGNOSIS — G43009 Migraine without aura, not intractable, without status migrainosus: Secondary | ICD-10-CM

## 2020-10-04 DIAGNOSIS — R45851 Suicidal ideations: Secondary | ICD-10-CM

## 2020-10-04 DIAGNOSIS — G43909 Migraine, unspecified, not intractable, without status migrainosus: Secondary | ICD-10-CM

## 2020-10-04 LAB — RESP PANEL BY RT-PCR (FLU A&B, COVID) ARPGX2
Influenza A by PCR: NEGATIVE
Influenza B by PCR: NEGATIVE
SARS Coronavirus 2 by RT PCR: POSITIVE — AB

## 2020-10-04 MED ORDER — FAMOTIDINE IN NACL 20-0.9 MG/50ML-% IV SOLN: 20.0000 mg | Freq: Once | INTRAVENOUS | Status: DC | PRN

## 2020-10-04 MED ORDER — ALBUTEROL SULFATE HFA 108 (90 BASE) MCG/ACT IN AERS
2.0000 | INHALATION_SPRAY | Freq: Once | RESPIRATORY_TRACT | Status: DC | PRN
  Filled 2020-10-04: qty 6.7

## 2020-10-04 MED ORDER — SODIUM CHLORIDE 0.9 % IV SOLN: INTRAVENOUS | Status: DC | PRN

## 2020-10-04 MED ORDER — DIPHENHYDRAMINE HCL 50 MG/ML IJ SOLN: 50.0000 mg | Freq: Once | INTRAMUSCULAR | Status: DC | PRN

## 2020-10-04 MED ORDER — BEBTELOVIMAB 175 MG/2 ML IV (EUA)
175.0000 mg | Freq: Once | INTRAMUSCULAR | Status: AC
  Administered 2020-10-04: 175 mg via INTRAVENOUS
  Filled 2020-10-04: qty 2

## 2020-10-04 MED ORDER — METHYLPREDNISOLONE SODIUM SUCC 125 MG IJ SOLR: 125.0000 mg | Freq: Once | INTRAMUSCULAR | Status: DC | PRN

## 2020-10-04 MED ORDER — EPINEPHRINE 0.3 MG/0.3ML IJ SOAJ: 0.3000 mg | Freq: Once | INTRAMUSCULAR | Status: DC | PRN

## 2020-10-04 NOTE — Consult Note (Signed)
Client continues to deny suicidal ideations.  Her comment was prompted by an intense migraine.  No past attempts or hospitalizations.  Her collateral and notes indicate no safety concerns.  IVC rescinded, psychiatrically stable for discharge.  Nanine Means, PMHNP

## 2020-10-04 NOTE — ED Notes (Signed)
Called spouse. He will head this way to pick her up.

## 2020-10-04 NOTE — BH Assessment (Addendum)
This Clinical research associate spoke with pt's spouse Leotis Shames [phone]) for collateral. Tinnie Gens reported that the pt has no history of suicidal threats or gestures in her past. Tinnie Gens reported that he has no concerns about her personal safety if discharged and was shocked that the concept was of concern. Leotis Shames agreed to secure the firearm in the home via a safety lock box.   Per Lodema Pilot D., NP pt is recommended for continued observation and reassessment in the AM. Patient's Family/Support System Leotis Shames [phone]) have been updated as well.

## 2020-10-04 NOTE — ED Notes (Signed)
IVC/Rescinded/ Plan to D/C  

## 2020-10-04 NOTE — ED Provider Notes (Addendum)
[facility] Emergency Department Provider Note  ____________________________________________   Event Date/Time   First MD Initiated Contact with Patient 10/03/20 2300     (approximate)  I have reviewed the triage vital signs and the nursing notes.   HISTORY  Chief Complaint Headache, Sore Throat, and Suicidal    HPI Natasha Reyes is a 40 y.o. female with history of obesity, migraine headaches who presents to the emergency department with complaints of suicidal thoughts.  States she plans to shoot herself with a gun and has access to a firearm at home.  She denies previous suicide attempt.  No HI, hallucinations, recent drug or alcohol use.  She states her suicidal thoughts are secondary to frequent migraine headaches.  She is having a migraine currently.  She describes it as diffuse, throbbing with photophobia and nausea.  No head injury.  Not on blood thinners.  No numbness, tingling or weakness.  She also reports over the past day she has felt poorly with fever, body aches, sore throat.  No cough, congestion, chest pain, shortness of breath, vomiting, diarrhea.  She has been vaccinated against COVID-19.  No known sick contacts.        History reviewed. No pertinent past medical history.  There are no problems to display for this patient.   Past Surgical History:  Procedure Laterality Date   CHOLECYSTECTOMY     LAPAROSCOPIC GASTRIC SLEEVE RESECTION     TUBAL LIGATION      Prior to Admission medications   Medication Sig Start Date End Date Taking? Authorizing Provider  Ascorbic Acid (VITAMIN C) 100 MG CHEW Chew by mouth.    [provider]  Biotin 1 MG CAPS Take by mouth.    [provider]  bisacodyl (DULCOLAX) 5 MG EC tablet Take by mouth.    [provider]  Inulin-Cholecalciferol 2.5-500 GM-UNIT CHEW Chew by mouth.    [provider]  Inulin-Cholecalciferol 2.5-500 GM-UNIT CHEW Chew by mouth.     [provider]  Multiple Vitamins-Minerals (MULTIVITAMIN ADULT EXTRA C PO) Take by mouth.    [provider]    Allergies Penicillins and Amoxicillin  History reviewed. No pertinent family history.  Social History Social History   Tobacco Use   Smoking status: Never   Smokeless tobacco: Never  Substance Use Topics   Alcohol use: No   Drug use: Never    Review of Systems Constitutional: + fever. Eyes: No visual changes. ENT: + sore throat. Cardiovascular: Denies chest pain. Respiratory: Denies shortness of breath. Gastrointestinal: No nausea, vomiting, diarrhea. Genitourinary: Negative for dysuria. Musculoskeletal: Negative for back pain. Skin: Negative for rash. Neurological: Negative for focal weakness or numbness.  ____________________________________________   PHYSICAL EXAM:  VITAL SIGNS: ED Triage Vitals [10/03/20 2155]  Enc Vitals Group     BP (!) 148/94     Pulse Rate (!) 110     Resp 20     Temp 100 F (37.8 C)     Temp Source Oral     SpO2 97 %     Weight 230 lb (104.3 kg)     Height 5\' 7"  (1.702 m)     Head Circumference      Peak Flow      Pain Score 10     Pain Loc      Pain Edu?      Excl. in GC?    CONSTITUTIONAL: Alert and oriented and responds appropriately to questions.  Obese, tearful HEAD:  Normocephalic, atraumatic EYES: Conjunctivae clear, pupils appear equal, EOM appear intact; + photophobia ENT: normal nose; moist mucous membranes NECK: Supple, normal ROM no meningismus; patient has pharyngeal erythema on exam but no tonsillar hypertrophy or exudate, no uvular deviation or uvular swelling, no trismus or drooling, no stridor, normal phonation CARD: RRR; S1 and S2 appreciated; no murmurs, no clicks, no rubs, no gallops RESP: Normal chest excursion without splinting or tachypnea; breath sounds clear and equal bilaterally; no wheezes, no rhonchi, no rales, no hypoxia or respiratory distress, speaking full  sentences ABD/GI: Normal bowel sounds; non-distended; soft, non-tender, no rebound, no guarding, no peritoneal signs, no hepatosplenomegaly BACK: The back appears normal EXT: Normal ROM in all joints; no deformity noted, no edema; no cyanosis SKIN: Normal color for age and race; warm; no rash on exposed skin NEURO: Moves all extremities equally, normal sensation diffusely, cranial nerves II through XII intact, normal speech, normal gait PSYCH: The patient's mood and manner are appropriate.  ____________________________________________   LABS (all labs ordered are listed, but only abnormal results are displayed)  Labs Reviewed  RESP PANEL BY RT-PCR (FLU A&B, COVID) ARPGX2 - Abnormal; Notable for the following components:      Result Value   SARS Coronavirus 2 by RT PCR POSITIVE (*)    All other components within normal limits  COMPREHENSIVE METABOLIC PANEL - Abnormal; Notable for the following components:   Sodium 134 (*)    CO2 20 (*)    Glucose, Bld 111 (*)    Calcium 8.7 (*)    All other components within normal limits  SALICYLATE LEVEL - Abnormal; Notable for the following components:   Salicylate Lvl <7.0 (*)    All other components within normal limits  ACETAMINOPHEN LEVEL - Abnormal; Notable for the following components:   Acetaminophen (Tylenol), Serum <10 (*)    All other components within normal limits  CBC - Abnormal; Notable for the following components:   Hemoglobin 15.1 (*)    All other components within normal limits  ETHANOL  URINE DRUG SCREEN, QUALITATIVE ([facility] ONLY)  POC URINE PREG, ED   ____________________________________________  EKG   ____________________________________________  RADIOLOGY I, Okema Rollinson, personally viewed and evaluated these images (plain radiographs) as part of my medical decision making, as well as reviewing the written report by the radiologist.  ED MD interpretation:    Official radiology report(s): No results  found.  ____________________________________________   PROCEDURES  Procedure(s) performed (including Critical Care):  Procedures    ____________________________________________   INITIAL IMPRESSION / ASSESSMENT AND PLAN / ED COURSE  As part of my medical decision making, I reviewed the following data within the electronic MEDICAL RECORD NUMBER Nursing notes reviewed and incorporated, Labs reviewed , Old chart reviewed, A consult was requested and obtained from this/these consultant(s) Psychiatry, and Notes from prior ED visits         Patient with suicidal thoughts with plan to shoot herself with a gun.  She has access to a firearm at home.  We will consult psychiatry and obtain screening labs, urine.  We will place her under full IVC.  Patient also complaining of her typical migraine headache.  No focal neurologic deficits.  No meningismus but does have low-grade temperature here.  Doubt meningitis.  Will give migraine cocktail and reassess.  Patient also complaining of body aches and sore throat.  I suspect that she likely has COVID-19 or another viral illness.  No signs of strep pharyngitis, tonsillitis, deep space neck infection, sepsis  on exam.  ED PROGRESS  Patient's labs unremarkable.  COVID test is positive.  Given she does have history of obesity with BMI greater than 35, I have offered her monoclonal antibodies versus 5-day course of paxlovid versus 3-day course of remdesivir.  Patient states that she would prefer monoclonal antibodies.  We have discussed risk and benefits.  She is on airborne precautions.  She has no chest pain, shortness of breath, hypoxia, increased work of breathing.  Does not meet criteria for inpatient treatment, steroids.  1:00 AM  Patient has been seen by Gloriann Loan, NP with psychiatry.  They would like to reassess patient in the morning.  Patient states she is no longer suicidal now that her headache is gone after migraine cocktail but we agree that  her presentation is concerning especially given she has access to a firearm at home.  Patient is comfortable with this plan to be reassessed by psychiatry later today for further disposition.  3:15 AM  Pt tolerating monoclonal antibody infusion without any complaints, reaction.  At this time she is medically cleared.   I reviewed all nursing notes and pertinent previous records as available.  I have reviewed and interpreted any EKGs, lab and urine results, imaging (as available).  ____________________________________________   FINAL CLINICAL IMPRESSION(S) / ED DIAGNOSES  Final diagnoses:  Suicidal ideation  Migraine without aura and without status migrainosus, not intractable  COVID-19     ED Discharge Orders     None       *Please note:  Natasha Reyes was evaluated in Emergency Department on 10/04/2020 for the symptoms described in the history of present illness. She was evaluated in the context of the global COVID-19 pandemic, which necessitated consideration that the patient might be at risk for infection with the SARS-CoV-2 virus that causes COVID-19. Institutional protocols and algorithms that pertain to the evaluation of patients at risk for COVID-19 are in a state of rapid change based on information released by regulatory bodies including the CDC and federal and state organizations. These policies and algorithms were followed during the patient's care in the ED.  Some ED evaluations and interventions may be delayed as a result of limited staffing during and the pandemic.*   Note:  This document was prepared using Dragon voice recognition software and may include unintentional dictation errors.    Dimitria Ketchum, Layla Maw, DO 10/04/20 0316    Khaliyah Northrop, Layla Maw, DO 10/04/20 [phone]

## 2020-10-04 NOTE — BH Assessment (Signed)
Comprehensive Clinical Assessment (CCA) Note  10/04/2020 Natasha HENRICKSON [id number] Recommendations for Services/Supports/Treatments: Psych NP Rashaun D. pt is recommended for overnight observation and reassessment.  Pt stated "Migraines" when asked what'd brought her to the hospital. Pt presented with soft, slurred speech. Pt was noted to be drowsy. Pt reported that she'd been relieved of her migraine. Pt had a responsive affect and her thoughts were intact. Pt did not appear to be responding to internal/external stimuli. Pt was cooperative about her suicidal thoughts and statements. When pt was asked if she'd act on her suicidal thoughts in the event that he migraine returned the pt stated, "I doubt it". The patient denied current SI, HI or AV/H.   Chief Complaint:  Chief Complaint  Patient presents with   Headache   Sore Throat   Suicidal   Visit Diagnosis: Psych evaluation *Diagnosis deferred"    CCA Screening, Triage and Referral (STR)  Patient Reported Information How did you hear about [place]? Self  Referral name: No data recorded Referral phone number: No data recorded  Whom do you see for routine medical problems? No data recorded Practice/Facility Name: No data recorded Practice/Facility Phone Number: No data recorded Name of Contact: No data recorded Contact Number: No data recorded Contact Fax Number: No data recorded Prescriber Name: No data recorded Prescriber Address (if known): No data recorded  What Is the Reason for Your Visit/Call Today? No data recorded How Long Has This Been Causing You Problems? > than 6 months  What Do You Feel Would Help You the Most Today? Medication(s)   Have You Recently Been in Any Inpatient Treatment (Hospital/Detox/Crisis Center/28-Day Program)? No data recorded Name/Location of Program/Hospital:No data recorded How Long Were You There? No data recorded When Were You Discharged? No data recorded  Have You Ever Received  Services From [facility] Before? No data recorded Who Do You See at [facility]? No data recorded  Have You Recently Had Any Thoughts About Hurting Yourself? Yes  Are You Planning to Commit Suicide/Harm Yourself At This time? No   Have you Recently Had Thoughts About Hurting Someone Karolee Ohs? No  Explanation: No data recorded  Have You Used Any Alcohol or Drugs in the Past 24 Hours? No  How Long Ago Did You Use Drugs or Alcohol? No data recorded What Did You Use and How Much? No data recorded  Do You Currently Have a Therapist/Psychiatrist? No  Name of Therapist/Psychiatrist: No data recorded  Have You Been Recently Discharged From Any Office Practice or Programs? No  Explanation of Discharge From Practice/Program: No data recorded    CCA Screening Triage Referral Assessment Type of Contact: Face-to-Face  Is this Initial or Reassessment? No data recorded Date Telepsych consult ordered in [facility]:  No data recorded Time Telepsych consult ordered in [facility]:  No data recorded  Patient Reported Information Reviewed? No data recorded Patient Left Without Being Seen? No data recorded Reason for Not Completing Assessment: No data recorded  Collateral Involvement: oliver,jeffery (Spouse)   [phone]   Does Patient Have a Court Appointed Legal Guardian? No data recorded Name and Contact of Legal Guardian: No data recorded If Minor and Not Living with Parent(s), Who has Custody? No data recorded Is CPS involved or ever been involved? Never  Is APS involved or ever been involved? Never   Patient Determined To Be At Risk for Harm To Self or Others Based on Review of Patient Reported Information or Presenting Complaint? No  Method: No data recorded Availability of  Means: No data recorded Intent: No data recorded Notification Required: No data recorded Additional Information for Danger to Others Potential: No data recorded Additional Comments for Danger to Others Potential: No data  recorded Are There Guns or Other Weapons in Your Home? No data recorded Types of Guns/Weapons: No data recorded Are These Weapons Safely Secured?                            No data recorded Who Could Verify You Are Able To Have These Secured: No data recorded Do You Have any Outstanding Charges, Pending Court Dates, Parole/Probation? No data recorded Contacted To Inform of Risk of Harm To Self or Others: No data recorded  [facility] of Assessment: [facility] ED   Does Patient Present under Involuntary Commitment? Yes  IVC Papers Initial File Date: 10/03/20   [place] of Residence: [place]   Patient Currently Receiving the Following Services: Not Receiving Services   Determination of Need: Emergent (2 hours)   Options For Referral: -- (Pt to be observed overnight and reassessed in the AM.)     CCA Biopsychosocial Intake/Chief Complaint:  No data recorded Current Symptoms/Problems: No data recorded  Patient Reported Schizophrenia/Schizoaffective Diagnosis in Past: No   Strengths: Pt is able to take care of ADLs.  Preferences: No data recorded Abilities: No data recorded  Type of Services Patient Feels are Needed: No data recorded  Initial Clinical Notes/Concerns: No data recorded  Mental Health Symptoms Depression:   None   Duration of Depressive symptoms: No data recorded  Mania:   None   Anxiety:    None   Psychosis:   None   Duration of Psychotic symptoms: No data recorded  Trauma:   None   Obsessions:   None   Compulsions:   None   Inattention:   None   Hyperactivity/Impulsivity:   None   Oppositional/Defiant Behaviors:   None   Emotional Irregularity:   N/A   Other Mood/Personality Symptoms:  No data recorded   Mental Status Exam Appearance and self-care  Stature:   Average   Weight:   Overweight   Clothing:   Casual   Grooming:   Normal   Cosmetic use:   None   Posture/gait:   Normal   Motor activity:   Not  Remarkable   Sensorium  Attention:   Normal   Concentration:   Normal   Orientation:   X5   Recall/memory:   Normal   Affect and Mood  Affect:   Full Range   Mood:   Anxious   Relating  Eye contact:   Normal   Facial expression:   Responsive   Attitude toward examiner:   Cooperative   Thought and Language  Speech flow:  Normal   Thought content:   Appropriate to Mood and Circumstances   Preoccupation:   None   Hallucinations:   None   Organization:  No data recorded  [employer] of Knowledge:   Average   Intelligence:   Average   Abstraction:   Normal   Judgement:   Impaired   Reality Testing:   Variable   Insight:   Lacking   Decision Making:   Vacilates   Social Functioning  Social Maturity:   Responsible   Social Judgement:   Normal   Stress  Stressors:   Illness   Coping Ability:   Exhausted   Skill Deficits:   None   Supports:  Family     Religion: Religion/Spirituality Are You A Religious Person?:  Industrial/product designer)  Leisure/Recreation: Leisure / Recreation Do You Have Hobbies?:  (UTA)  Exercise/Diet: Exercise/Diet Do You Exercise?:  (UTA) Have You Gained or Lost A Significant Amount of Weight in the Past Six Months?: No Do You Follow a Special Diet?: No Do You Have Any Trouble Sleeping?: No   CCA Employment/Education Employment/Work Situation: Employment / Work Clinical biochemist has Been Impacted by Current Illness: No Has Patient ever Been in Equities trader?: No  Education:     CCA Family/Childhood History Family and Relationship History: Family history Marital status: Long term relationship  Childhood History:  Childhood History Did patient suffer any verbal/emotional/physical/sexual abuse as a child?: No Did patient suffer from severe childhood neglect?: No Has patient ever been sexually abused/assaulted/raped as an adolescent or adult?: No Was the patient ever a victim of a  crime or a disaster?: No Witnessed domestic violence?: No Has patient been affected by domestic violence as an adult?: No  Child/Adolescent Assessment:     CCA Substance Use Alcohol/Drug Use: Alcohol / Drug Use Pain Medications: See MAR Prescriptions: See MAR Over the Counter: See MAR History of alcohol / drug use?: No history of alcohol / drug abuse                         ASAM's:  Six Dimensions of Multidimensional Assessment  Dimension 1:  Acute Intoxication and/or Withdrawal Potential:      Dimension 2:  Biomedical Conditions and Complications:      Dimension 3:  Emotional, Behavioral, or Cognitive Conditions and Complications:     Dimension 4:  Readiness to Change:     Dimension 5:  Relapse, Continued use, or Continued Problem Potential:     Dimension 6:  Recovery/Living Environment:     ASAM Severity Score:    ASAM Recommended Level of Treatment:     Substance use Disorder (SUD)    Recommendations for Services/Supports/Treatments:    DSM5 Diagnoses: There are no problems to display for this patient.    Geneive Sandstrom R Fionnuala Hemmerich, LCAS

## 2020-10-04 NOTE — ED Notes (Signed)
Placed pt breakfast and juice at bedside. Pt remains asleep at this time.

## 2020-10-04 NOTE — ED Notes (Signed)
Continues to rest quietly with eyes closed, denies any complaints at this time.

## 2020-10-04 NOTE — Consult Note (Signed)
[facility] Face-to-Face Psychiatry Consult   Reason for Consult:  Psychiatric evaluation  Referring Physician:  Dr. Elesa Massed Patient Identification: Natasha Reyes MRN:  [id number] Principal Diagnosis: Migraine headache Diagnosis:  Principal Problem:   Migraine headache Active Problems:   Suicidal ideation   Total Time spent with patient: 1 hour  Subjective:   Natasha Reyes is a 40 y.o. female patient admitted with severe migraine.  She expresses that if she doesn't get relief, she would shoot herself.  HPI: Natasha Reyes, 40 y.o., female patient presented to [facility] with complaints of a migraine that's non responsive to her current medication regimen.  Patient seen by TTS and this provider; chart reviewed and consulted with Dr. Elesa Massed on 10/04/20.  Per chart review, patient has no prior mental health diagnosis. On evaluation Natasha Reyes reports that she is feeling better. She is no longer experiencing pain from her migraine and therefore is no longer suicidal.  As it turns out, patient is Covid +. Patient's husband was contacted and he states that he can safe guard the firearm should she be released and that he has no concern of patients ability to remain safe. He states that patient has never expressed a desire to kill herself nor has she been depressed.   Per TTS: Pt stated "Migraines" when asked what'd brought her to the hospital. Pt presented with soft, slurred speech. Pt was noted to be drowsy. Pt reported that she'd been relieved of her migraine. Pt had a responsive affect and her thoughts were intact. Pt did not appear to be responding to internal/external stimuli. Pt was cooperative about her suicidal thoughts and statements. When pt was asked if she'd act on her suicidal thoughts in the event that he migraine returned the pt stated, "I doubt it". The patient denied current SI, HI or AV/H.  TTS spoke with pt's spouse Natasha Shames [phone]) for collateral. Tinnie Gens reported that  the pt has no history of suicidal threats or gestures in her past. Tinnie Gens reported that he has no concerns about her personal safety if discharged and was shocked that the concept was of concern. Natasha Shames agreed to secure the firearm in the home via a safety lock box.  Recommendations: DC in the am if patient is medically cleared and remains psychiatrically stable    Past Psychiatric History: No PMH  Risk to Self:  no Risk to Others:   Prior Inpatient Therapy:   Prior Outpatient Therapy:    Past Medical History: History reviewed. No pertinent past medical history.  Past Surgical History:  Procedure Laterality Date   CHOLECYSTECTOMY     LAPAROSCOPIC GASTRIC SLEEVE RESECTION     TUBAL LIGATION     Family History: History reviewed. No pertinent family history. Family Psychiatric  History: unknown Social History:  Social History   Substance and Sexual Activity  Alcohol Use No     Social History   Substance and Sexual Activity  Drug Use Never    Social History   Socioeconomic History   Marital status: Single    Spouse name: Not on file   Number of children: Not on file   Years of education: Not on file   Highest education level: Not on file  Occupational History   Not on file  Tobacco Use   Smoking status: Never   Smokeless tobacco: Never  Substance and Sexual Activity   Alcohol use: No   Drug use: Never   Sexual activity: Yes    Partners: Male  Birth control/protection: Other-see comments    Comment: tubal   Other Topics Concern   Not on file  Social History Narrative   Not on file   Social Determinants of Health   Financial Resource Strain: Not on file  Food Insecurity: Not on file  Transportation Needs: Not on file  Physical Activity: Not on file  Stress: Not on file  Social Connections: Not on file   Additional Social History:    Allergies:   Allergies  Allergen Reactions   Penicillins Hives   Amoxicillin Hives    Labs:  Results for  orders placed or performed during the hospital encounter of 10/03/20 (from the past 48 hour(s))  Comprehensive metabolic panel     Status: Abnormal   Collection Time: 10/03/20  9:59 PM  Result Value Ref Range   Sodium 134 (L) 135 - 145 mmol/L   Potassium 3.9 3.5 - 5.1 mmol/L   Chloride 103 98 - 111 mmol/L   CO2 20 (L) 22 - 32 mmol/L   Glucose, Bld 111 (H) 70 - 99 mg/dL    Comment: Glucose reference range applies only to samples taken after fasting for at least 8 hours.   BUN 10 6 - 20 mg/dL   Creatinine, Ser 1.610.83 0.44 - 1.00 mg/dL   Calcium 8.7 (L) 8.9 - 10.3 mg/dL   Total Protein 7.8 6.5 - 8.1 g/dL   Albumin 3.7 3.5 - 5.0 g/dL   AST 23 15 - 41 U/L   ALT 22 0 - 44 U/L   Alkaline Phosphatase 48 38 - 126 U/L   Total Bilirubin 0.4 0.3 - 1.2 mg/dL   GFR, Estimated >09>60 >60>60 mL/min    Comment: (NOTE) Calculated using the CKD-EPI Creatinine Equation (2021)    Anion gap 11 5 - 15    Comment: Performed at [facility], [address] [place], [place] [address]  Ethanol     Status: None   Collection Time: 10/03/20  9:59 PM  Result Value Ref Range   Alcohol, Ethyl (B) <10 <10 mg/dL    Comment: (NOTE) Lowest detectable limit for serum alcohol is 10 mg/dL.  For medical purposes only. Performed at [facility], [address] [place], [place] [address]   Salicylate level     Status: Abnormal   Collection Time: 10/03/20  9:59 PM  Result Value Ref Range   Salicylate Lvl <7.0 (L) 7.0 - 30.0 mg/dL    Comment: Performed at [facility], [address] [place], [place] [address]  Acetaminophen level     Status: Abnormal   Collection Time: 10/03/20  9:59 PM  Result Value Ref Range   Acetaminophen (Tylenol), Serum <10 (L) 10 - 30 ug/mL    Comment: (NOTE) Therapeutic concentrations vary significantly. A range of 10-30 ug/mL  may be an effective concentration for many patients. However, some  are best treated at concentrations outside of this  range. Acetaminophen concentrations >150 ug/mL at 4 hours after ingestion  and >50 ug/mL at 12 hours after ingestion are often associated with  toxic reactions.  Performed at [facility], [address] [place], [place] [address]   cbc     Status: Abnormal   Collection Time: 10/03/20  9:59 PM  Result Value Ref Range   WBC 6.1 4.0 - 10.5 K/uL   RBC 4.96 3.87 - 5.11 MIL/uL   Hemoglobin 15.1 (H) 12.0 - 15.0 g/dL   HCT 08.644.7 57.836.0 - 46.946.0 %   MCV 90.1 80.0 -  100.0 fL   MCH 30.4 26.0 - 34.0 pg   MCHC 33.8 30.0 - 36.0 g/dL   RDW 37.1 06.2 - 69.4 %   Platelets 202 150 - 400 K/uL   nRBC 0.0 0.0 - 0.2 %    Comment: Performed at [facility], [address] [place], [place] [address]  Resp Panel by RT-PCR (Flu A&B, Covid) Nasopharyngeal Swab     Status: Abnormal   Collection Time: 10/04/20 12:22 AM   Specimen: Nasopharyngeal Swab; Nasopharyngeal(NP) swabs in vial transport medium  Result Value Ref Range   SARS Coronavirus 2 by RT PCR POSITIVE (A) NEGATIVE    Comment: RESULT CALLED TO, READ BACK BY AND VERIFIED WITH: DAWN [facility] AT 0117 10/04/2020 DLB (NOTE) SARS-CoV-2 target nucleic acids are DETECTED.  The SARS-CoV-2 RNA is generally detectable in upper respiratory specimens during the acute phase of infection. Positive results are indicative of the presence of the identified virus, but do not rule out bacterial infection or co-infection with other pathogens not detected by the test. Clinical correlation with patient history and other diagnostic information is necessary to determine patient infection status. The expected result is Negative.  Fact Sheet for Patients: BloggerCourse.com  Fact Sheet for Healthcare Providers: SeriousBroker.it  This test is not yet approved or cleared by the [place] FDA and  has been authorized for detection and/or diagnosis of SARS-CoV-2 by FDA under an Emergency Use  Authorization (EUA).  This EUA will remain in effect (meaning this test can b e used) for the duration of  the COVID-19 declaration under Section 564(b)(1) of the Act, 21 U.S.C. section 360bbb-3(b)(1), unless the authorization is terminated or revoked sooner.     Influenza A by PCR NEGATIVE NEGATIVE   Influenza B by PCR NEGATIVE NEGATIVE    Comment: (NOTE) The Xpert Xpress SARS-CoV-2/FLU/RSV plus assay is intended as an aid in the diagnosis of influenza from Nasopharyngeal swab specimens and should not be used as a sole basis for treatment. Nasal washings and aspirates are unacceptable for Xpert Xpress SARS-CoV-2/FLU/RSV testing.  Fact Sheet for Patients: BloggerCourse.com  Fact Sheet for Healthcare Providers: SeriousBroker.it  This test is not yet approved or cleared by the [place] FDA and has been authorized for detection and/or diagnosis of SARS-CoV-2 by FDA under an Emergency Use Authorization (EUA). This EUA will remain in effect (meaning this test can be used) for the duration of the COVID-19 declaration under Section 564(b)(1) of the Act, 21 U.S.C. section 360bbb-3(b)(1), unless the authorization is terminated or revoked.  Performed at [facility], [address] [place], [place] [address]     Current Facility-Administered Medications  Medication Dose Route Frequency Provider Last Rate Last Admin   0.9 %  sodium chloride infusion   Intravenous PRN Ward, Kristen N, DO       albuterol (VENTOLIN HFA) 108 (90 Base) MCG/ACT inhaler 2 puff  2 puff Inhalation Once PRN Ward, Kristen N, DO       diphenhydrAMINE (BENADRYL) injection 50 mg  50 mg Intravenous Once PRN Ward, Kristen N, DO       EPINEPHrine (EPI-PEN) injection 0.3 mg  0.3 mg Intramuscular Once PRN Ward, Kristen N, DO       famotidine (PEPCID) IVPB 20 mg premix  20 mg Intravenous Once PRN Ward, Kristen N, DO       methylPREDNISolone sodium succinate  (SOLU-MEDROL) 125 mg/2 mL injection 125 mg  125 mg Intravenous Once PRN Ward, Layla Maw, DO  Current Outpatient Medications  Medication Sig Dispense Refill   Ascorbic Acid (VITAMIN C) 100 MG CHEW Chew by mouth.     Biotin 1 MG CAPS Take by mouth.     bisacodyl (DULCOLAX) 5 MG EC tablet Take by mouth.     Inulin-Cholecalciferol 2.5-500 GM-UNIT CHEW Chew by mouth.     Inulin-Cholecalciferol 2.5-500 GM-UNIT CHEW Chew by mouth.     Multiple Vitamins-Minerals (MULTIVITAMIN ADULT EXTRA C PO) Take by mouth.      Musculoskeletal: Strength & Muscle Tone: within normal limits Gait & Station: normal Patient leans: N/A   Psychiatric Specialty Exam:  Presentation  General Appearance: Appropriate for Environment; Casual  Eye Contact:Fair  Speech:Slow  Speech Volume:Decreased  Handedness:Right   Mood and Affect  Mood:Euthymic  Affect:Appropriate   Thought Process  Thought Processes:Coherent  Descriptions of Associations:Intact  Orientation:Full (Time, Place and Person)  Thought Content:Logical  History of Schizophrenia/Schizoaffective disorder:No  Duration of Psychotic Symptoms:No data recorded Hallucinations:Hallucinations: None  Ideas of Reference:None  Suicidal Thoughts:Suicidal Thoughts: No  Homicidal Thoughts:Homicidal Thoughts: No   Sensorium  Memory:Recent Good; Immediate Good  Judgment:Fair  Insight:Fair   Executive Functions  Concentration:Fair  Attention Span:Fair  Recall:Fair  Fund of Knowledge:Fair  Language:Fair   Psychomotor Activity  Psychomotor Activity:Psychomotor Activity: Normal   Assets  Assets:Communication Skills; Desire for Improvement; Financial Resources/Insurance; Physical Health; Social Support   Sleep  Sleep:Sleep: Fair   Physical Exam: Physical Exam Vitals and nursing note reviewed.  Constitutional:      General: She is in acute distress.  HENT:     Head: Normocephalic and atraumatic.  Pulmonary:      Effort: Pulmonary effort is normal.  Musculoskeletal:        General: Normal range of motion.     Cervical back: Normal range of motion.  Skin:    General: Skin is warm.  Neurological:     Mental Status: She is alert and oriented to person, place, and time. Mental status is at baseline.  Psychiatric:        Mood and Affect: Mood normal.        Speech: Speech normal.        Behavior: Behavior normal.   Review of Systems  Psychiatric/Behavioral:  Negative for depression, hallucinations, substance abuse and suicidal ideas.   All other systems reviewed and are negative. Blood pressure 108/63, pulse 66, temperature 98.8 F (37.1 C), temperature source Oral, resp. rate 16, height 5\' 7"  (1.702 m), weight 104.3 kg, SpO2 95 %. Body mass index is 36.02 kg/m.  Treatment Plan Summary: Plan DC in the am if patient is medically cleared and remains psychiatrically stable  Disposition: No evidence of imminent risk to self or others at present.   Patient does not meet criteria for psychiatric inpatient admission. Discussed crisis plan, support from social network, calling 911, coming to the Emergency Department, and calling Suicide Hotline.  , NP 10/04/2020 4:31 AM

## 2020-10-04 NOTE — ED Notes (Signed)
Patient resting quietly at this time with eyes closed, denies any complaint at this time.

## 2020-10-04 NOTE — Discharge Instructions (Addendum)
Please seek medical attention and help for any thoughts about wanting to harm yourself, harm others, any concerning change in behavior, severe depression, inappropriate drug use or any other new or concerning symptoms. Please seek medical attention for any high fevers, chest pain, shortness of breath, change in behavior, persistent vomiting, bloody stool or any other new or concerning symptoms.  [facility]  [place] Address: [address], [place], [place] [address] Hours:  Closed ? Oneita Jolly Phone: [phone]

## 2020-10-04 NOTE — ED Notes (Signed)
Patient resting quietly with eyes closed, denies any complaint at this time.

## 2023-09-13 ENCOUNTER — Other Ambulatory Visit: Payer: Self-pay | Admitting: Family Medicine

## 2023-09-13 DIAGNOSIS — Z1231 Encounter for screening mammogram for malignant neoplasm of breast: Secondary | ICD-10-CM
